# Patient Record
Sex: Male | Born: 1940 | Race: White | Hispanic: No | Marital: Married
Health system: Southern US, Community
[De-identification: ages and names within clinical notes are randomized; demographics above are authoritative.]

---

## 2003-05-31 ENCOUNTER — Encounter: Admission: RE | Admit: 2003-05-31 | Discharge: 2003-05-31 | Payer: Self-pay | Admitting: Family Medicine

## 2003-11-16 ENCOUNTER — Encounter (INDEPENDENT_AMBULATORY_CARE_PROVIDER_SITE_OTHER): Payer: Self-pay | Admitting: Specialist

## 2003-11-16 ENCOUNTER — Inpatient Hospital Stay (HOSPITAL_COMMUNITY): Admission: RE | Admit: 2003-11-16 | Discharge: 2003-11-18 | Payer: Self-pay | Admitting: Urology

## 2004-06-06 ENCOUNTER — Emergency Department (HOSPITAL_COMMUNITY): Admission: EM | Admit: 2004-06-06 | Discharge: 2004-06-07 | Payer: Self-pay | Admitting: Emergency Medicine

## 2006-06-18 ENCOUNTER — Encounter: Admission: RE | Admit: 2006-06-18 | Discharge: 2006-06-18 | Payer: Self-pay | Admitting: Surgery

## 2010-04-10 ENCOUNTER — Ambulatory Visit
Admission: RE | Admit: 2010-04-10 | Discharge: 2010-04-18 | Payer: Self-pay | Source: Home / Self Care | Admitting: Radiation Oncology

## 2010-04-23 ENCOUNTER — Ambulatory Visit (HOSPITAL_COMMUNITY): Admission: RE | Admit: 2010-04-23 | Discharge: 2010-04-23 | Payer: Self-pay | Admitting: *Deleted

## 2010-05-02 ENCOUNTER — Ambulatory Visit
Admission: RE | Admit: 2010-05-02 | Discharge: 2010-07-12 | Payer: Self-pay | Source: Home / Self Care | Attending: Radiation Oncology | Admitting: Radiation Oncology

## 2010-11-30 NOTE — Op Note (Signed)
NAME:  GAGAN, DILLION NO.:  [id number]   MEDICAL RECORD NO.:  [id number]          PATIENT TYPE:  EMS   LOCATION:  ED                           FACILITY:  [facility]   PHYSICIAN:  Rozanna Boer., M.D.DATE OF BIRTH:  July 12, 1941   DATE OF PROCEDURE:  06/07/2004  DATE OF DISCHARGE:                                 OPERATIVE REPORT   HISTORY OF PRESENT ILLNESS:  This 70 year old patient came to the emergency  room in urinary retention. He had had a prostatectomy by Dr. Logan Bores for  prostate cancer in May. He had had progressive slowing of his stream so that  when he was on vacation at [facility] earlier today he actually went into  urinary retention. A nurse practitioner was able to get a pediatric catheter  in his bladder to give him some relief, but on the way home, he was unable  to urinate and came in in urinary retention. He had difficulty voiding when  the original catheter came out after surgery, but he was able to do okay  since then. He stopped using pads a couple of months ago. He has no other  significant medical problems. Takes no other medication. His vital signs  were normal. Past medical history, social history, and review of systems  noted on the previous medical record.   PHYSICAL EXAMINATION:  GENERAL:  The patient is a bearded white male in no  acute distress. He has gray hair.  HEENT:  Clear.  NECK:  Supple.  LUNGS:  Clear.  ABDOMEN:  Soft but distended. He had a low midline abdominal scar that had a  keloid appearance. The penis was normal circumcised, adequate meatus,  bilateral distended testes, epididymis nontender.  RECTAL:  Fossa was empty.  EXTREMITIES:  Negative. No edema. Good distal pulses.   I prepped him with Betadine and then passed a flexible cystoscope. No  anterior strictures were seen, but the posterior urethra showed a tight  bladder neck contracture of about 6 [place] or so. I was able through the  flexible cystoscope get  a guide wire through this under direct vision. Over  the guide wire, I then tried to pass a #16 Council catheter, but this did  not go. I then was able to pass a #14 dilator over the guide wire, but I  could not get a #16 to go. I then under direct vision passed a filiform into  the bladder and then over this used a #18 follower and was able to finally  break through the bladder neck contracture. This relieved his urine  retention. I then pulled this out and was able to get a #16 Foley catheter  into the bladder and inflated the balloon to 10 cc. The bladder drained, and  he was sent home with his Foley catheter as well as a leg bag and was put on  Cipro for 5 days and given some Vicodin for pain. He has an appointment to  see Dr. Logan Bores on December 2, and at that time, Dr. Logan Bores can decide whether  or not to give a voiding trial  or do soft catheter dilations or even  schedule him for surgery if this recurs.   IMPRESSION:  1.  Bladder neck contraction.  2.  Previous carcinoma of the prostate with radical retropubic prostatectomy      in May of 2005.    HMK/MEDQ  D:  06/07/2004  T:  06/07/2004  Job:  [id number]

## 2010-11-30 NOTE — Op Note (Signed)
NAME:  Brian Mosley, Brian Mosley                  ACCOUNT NO.:  [id number]   MEDICAL RECORD NO.:  [id number]                   PATIENT TYPE:  INP   LOCATION:  X002                                 FACILITY:  [facility]   PHYSICIAN:  Jamison Neighbor, M.D.               DATE OF BIRTH:  1940-11-10   DATE OF PROCEDURE:  11/16/2003  DATE OF DISCHARGE:                                 OPERATIVE REPORT   PREOPERATIVE DIAGNOSIS:  Carcinoma of the prostate.   POSTOPERATIVE DIAGNOSIS:  Carcinoma of the prostate.   PROCEDURE:  Radical retropubic prostatectomy.   SURGEON:  Jamison Neighbor, M.D.   ASSISTANT:  Lucrezia Starch. Earlene Plater, M.D.   ANESTHESIA:  General.   COMPLICATIONS:  None.   DRAINS:  A 20 French Silastic Foley catheter and also a Jackson-Pratt drain.   HISTORY:  This 70 year old male had a change in his PSA.  For this reason,  was evaluated with a prostatic ultrasound and biopsy.  His PSA was 5.15.  The patient had a Gleason score of 6 on the right and a Gleason score of 6  on the left.  These were low-volume disease with only 10% of the cores on  the right being positive and 5% positive on the left.   The patient has been given numerous options for therapy.  We elected not to  do watchful waiting because of his young age.  We discussed radiation  therapy, radiation seed implantation, surgery, as well as more experimental  options such as cryotherapy and laparoscopy.  The patient was fully informed  after all of the potential risks and benefits that he elected to do.  The  patient read extensively and took his time figuring this out and eventually  decided to go ahead with a radical retropubic prostatectomy.  He has been  fully appraised as of the risks and benefits, specifically the incidence of  incontinence and impotence as well as the postoperative recovery period.  Full informed consent was obtained.   PROCEDURE:  After successful induction of general anesthesia, the patient  was  placed in the prone position with a slight bump under his hips to  elevate the pelvis.  He was then prepped and draped in the usual sterile  fashion.  A lower midline incision was made and was carried down through  Scarpa fascia until the rectus abdominus was identified.  The rectus sheath  was split in the midline, and then the rectus muscle and the pyramidalis  muscle were retracted laterally.  Entering the space of Retzius was  performed.  Since the patient had only a Gleason score of 6 disease.  It was  felt that the incidence of a positive node was possibly 1%, and for that  reason, the area was inspected, but a formal node dissection was not done.  The obturator nerves were easily identified and were not covered by any  significant amount of nodal tissue.  All nodal tissue  appeared to be  completely normal, and it was felt that a node dissection was unnecessary.  The endopelvic fascia was opened on each side, extending up to the  puboprostatics.  The puboprostatics were easily identified and were  transected, dropping the prostate off the back of the pubic bone.  A stitch  for back-bleeding was placed to control the back-bleeding from the dorsal  vein.  A Hohenfellner clamp was passed between the urethra and the dorsal  vein complex, and the dorsal vein was then tied off.  It revealed some  lateral bleeders.  Additional over-sewing of the dorsal vein was performed  until completely controlled.  The urethra was then cut on the top side with  a nice stump of the urethra obtained.  The back of the urethra was then cut,  transecting the urethra.  The Foley catheter was used to elevate the  prostate.  The lateral pedicles were taken down with great care taken to  insure the neurovascular bundle had been released.  The pedicles were  controlled on each side with Weck clips.  The Denonvilliers fascia was then  cut off the top of the vasa and seminal vesicles, and these were each  clipped and  divided.  The bladder neck was then transected.  An attempt was  made to preserve as much of the circular fibers of the bladder as possible;  however, it was very clear that the patient had medium-load extension.  This  needed to be shelved out of the prostate.  Great care was taken to avoid any  injury to the trigone or ureters.  This made a somewhat larger bladder neck  opening than usual, but the ureters and trigone were well preserved.  The  few remaining attachments were taken down, and a specimen was sent.  It  appeared to be an intact prostate with just the beginnings of the seminal  vesicles and vasa attached.  Inspection showed good hemostasis.  Attention  was then directed to the bladder neck.  The mucosa was everted with a series  of 4-0 Vicryl sutures, three horizontal mattress sutures of 2-0 Vicryl were  used to close the bottom of the bladder neck in a standard tennis-racket  fashion, and this was utilized to close the opening down to where it would  take the tip of the fifth finger.  The urethral stump was then visualized.  Five sutures of Monocryl double-armed were then placed at the 12 o'clock, 2  o'clock, 5 o'clock, 7 o'clock, and 10 o'clock positions.  They were then  placed into the urethra at the same level and tied down appropriately.  The  Foley catheter had been inserted, and the Foley catheter was then inflated.  A Jackson-Pratt drain was placed and sutured in place with a silk suture.  The incision was then closed with a running suture of #1 PDS.  The skin was  closed with surgical clips.  The patient tolerated the procedure well and  was taken to the recovery room in good condition.  All of the sponge,  instrument, and needle counts were ascertained to be correct.                                               Jamison Neighbor, M.D.    RJE/MEDQ  D:  11/16/2003  T:  11/16/2003  Job:  [id number]  cc:   Vale Haven. Andrey Campanile, M.D.  [address]  [place]  [place]  [address]  Fax: [phone]

## 2010-11-30 NOTE — H&P (Signed)
NAME:  Brian Mosley, Brian Mosley                  ACCOUNT NO.:  [id number]   MEDICAL RECORD NO.:  [id number]                   PATIENT TYPE:  INP   LOCATION:  X002                                 FACILITY:  [facility]   PHYSICIAN:  Jamison Neighbor, M.D.               DATE OF BIRTH:  1941-01-23   DATE OF ADMISSION:  11/16/2003  DATE OF DISCHARGE:                                HISTORY & PHYSICAL   ADMISSION DIAGNOSIS:  Carcinoma of the prostate.   SECONDARY DIAGNOSIS:  Hypertension.   HISTORY:  This 70 year old male developed an elevated PSA of 5.15 and  underwent evaluation with ultrasound and biopsy.  He was found to have a low  volume of prostate cancer, but it was bilateral Gleason score 6.  After  careful assessment of all the pro's and con's, the patient has elected to  have a radical prostatectomy done here.  He has had extensive counseling  that led him to this conclusion, and he is fully aware of the pro's and  con's and risks and benefits.  He is to be admitted following this  procedure.   Patient's past medical history is quite unremarkable.  He has moderate  hypertension, for which he takes hydrochlorothiazide 12.5 mg daily.  He has  had no surgery other than a tonsillectomy at age 34.  Aside from the  hydrochlorothiazide, his other medications are lisinopril 10 mg, Lipitor 10  mg, and 1 aspirin daily, which is on hold.   His review of systems is negative.  The patient denies any ongoing coronary,  pulmonary, musculoskeletal, neurologic, endocrine, vascular,  gastrointestinal, dermatologic, or other urologic disorders.   He drinks modest amounts of alcohol and has not used tobacco in over 30  years.   His family history is noncontributory.   PHYSICAL EXAMINATION:  VITAL SIGNS:  Temperature 98, pulse 80, respiratory  rate 16, blood pressure 118/78.  GENERAL:  He is a well-developed and well-nourished male in no acute  distress.  Cranial nerves II-XII are grossly intact.  HEENT:  Normocephalic and atraumatic.  NECK:  Supple.  No adenopathy or thyromegaly.  LUNGS:  Clear.  HEART:  Regular rate and rhythm.  No murmurs, thrills, gallops, rubs, or  heaves.  ABDOMEN:  Soft and nontender.  No palpable masses,  rebound or guarding.  GU:  Unremarkable.  His penis is normal.  Testicles are normal in size,  shape, and consistency with no hydroceles, dermatocele, varicocele, or  hernia adenopathy.  RECTAL:  A benign-appearing prostate without obvious nodularity.  There is  clearly no sign of encroachment into the sulcus on either side.  EXTREMITIES:  No clubbing, cyanosis or edema.  NEUROLOGIC:  Appears grossly intact.   IMPRESSION:  Carcinoma of the prostate.   PLAN:  Admit following radical retropubic prostatectomy.  Jamison Neighbor, M.D.    RJE/MEDQ  D:  11/16/2003  T:  11/16/2003  Job:  [id number]   cc:   Vale Haven. Andrey Campanile, M.D.  [address]  [place]  [place] [address]  Fax: [phone]

## 2011-11-04 DIAGNOSIS — C61 Malignant neoplasm of prostate: Secondary | ICD-10-CM | POA: Diagnosis not present

## 2011-11-05 DIAGNOSIS — M545 Low back pain, unspecified: Secondary | ICD-10-CM | POA: Diagnosis not present

## 2011-11-07 ENCOUNTER — Other Ambulatory Visit: Payer: Self-pay | Admitting: Family Medicine

## 2011-11-07 ENCOUNTER — Ambulatory Visit
Admission: RE | Admit: 2011-11-07 | Discharge: 2011-11-07 | Disposition: A | Discharge status: Home / Self Care | Payer: Medicare Other | Source: Ambulatory Visit | Attending: Family Medicine | Admitting: Family Medicine

## 2011-11-07 DIAGNOSIS — M545 Low back pain, unspecified: Secondary | ICD-10-CM

## 2011-11-07 DIAGNOSIS — M47817 Spondylosis without myelopathy or radiculopathy, lumbosacral region: Secondary | ICD-10-CM | POA: Diagnosis not present

## 2011-11-07 DIAGNOSIS — M25559 Pain in unspecified hip: Secondary | ICD-10-CM | POA: Diagnosis not present

## 2011-11-07 DIAGNOSIS — M5137 Other intervertebral disc degeneration, lumbosacral region: Secondary | ICD-10-CM | POA: Diagnosis not present

## 2011-11-11 DIAGNOSIS — N529 Male erectile dysfunction, unspecified: Secondary | ICD-10-CM | POA: Diagnosis not present

## 2011-11-11 DIAGNOSIS — N486 Induration penis plastica: Secondary | ICD-10-CM | POA: Diagnosis not present

## 2011-11-11 DIAGNOSIS — C61 Malignant neoplasm of prostate: Secondary | ICD-10-CM | POA: Diagnosis not present

## 2011-11-13 ENCOUNTER — Ambulatory Visit: Payer: Medicare Other | Attending: Family Medicine

## 2011-11-13 DIAGNOSIS — IMO0001 Reserved for inherently not codable concepts without codable children: Secondary | ICD-10-CM | POA: Insufficient documentation

## 2011-11-13 DIAGNOSIS — M545 Low back pain, unspecified: Secondary | ICD-10-CM | POA: Diagnosis not present

## 2011-11-25 ENCOUNTER — Ambulatory Visit: Payer: Medicare Other | Admitting: Physical Therapy

## 2011-11-27 ENCOUNTER — Ambulatory Visit: Payer: Medicare Other | Admitting: Physical Therapy

## 2011-12-02 ENCOUNTER — Ambulatory Visit: Payer: Medicare Other | Admitting: Physical Therapy

## 2011-12-04 ENCOUNTER — Ambulatory Visit: Payer: Medicare Other | Admitting: Physical Therapy

## 2012-04-27 DIAGNOSIS — L570 Actinic keratosis: Secondary | ICD-10-CM | POA: Diagnosis not present

## 2012-04-27 DIAGNOSIS — L821 Other seborrheic keratosis: Secondary | ICD-10-CM | POA: Diagnosis not present

## 2012-04-28 DIAGNOSIS — Z79899 Other long term (current) drug therapy: Secondary | ICD-10-CM | POA: Diagnosis not present

## 2012-04-28 DIAGNOSIS — E7800 Pure hypercholesterolemia, unspecified: Secondary | ICD-10-CM | POA: Diagnosis not present

## 2012-04-28 DIAGNOSIS — Z23 Encounter for immunization: Secondary | ICD-10-CM | POA: Diagnosis not present

## 2012-05-04 DIAGNOSIS — C61 Malignant neoplasm of prostate: Secondary | ICD-10-CM | POA: Diagnosis not present

## 2012-05-11 DIAGNOSIS — N529 Male erectile dysfunction, unspecified: Secondary | ICD-10-CM | POA: Diagnosis not present

## 2012-05-11 DIAGNOSIS — C61 Malignant neoplasm of prostate: Secondary | ICD-10-CM | POA: Diagnosis not present

## 2012-05-22 DIAGNOSIS — I10 Essential (primary) hypertension: Secondary | ICD-10-CM | POA: Diagnosis not present

## 2012-05-22 DIAGNOSIS — Z79899 Other long term (current) drug therapy: Secondary | ICD-10-CM | POA: Diagnosis not present

## 2012-05-22 DIAGNOSIS — C61 Malignant neoplasm of prostate: Secondary | ICD-10-CM | POA: Diagnosis not present

## 2012-05-22 DIAGNOSIS — E7800 Pure hypercholesterolemia, unspecified: Secondary | ICD-10-CM | POA: Diagnosis not present

## 2012-05-22 DIAGNOSIS — Z0000 Encounter for general adult medical examination without abnormal findings: Secondary | ICD-10-CM | POA: Diagnosis not present

## 2012-05-28 DIAGNOSIS — H2510 Age-related nuclear cataract, unspecified eye: Secondary | ICD-10-CM | POA: Diagnosis not present

## 2012-11-12 DIAGNOSIS — C61 Malignant neoplasm of prostate: Secondary | ICD-10-CM | POA: Diagnosis not present

## 2012-11-20 DIAGNOSIS — N486 Induration penis plastica: Secondary | ICD-10-CM | POA: Diagnosis not present

## 2012-11-20 DIAGNOSIS — N529 Male erectile dysfunction, unspecified: Secondary | ICD-10-CM | POA: Diagnosis not present

## 2012-11-20 DIAGNOSIS — C61 Malignant neoplasm of prostate: Secondary | ICD-10-CM | POA: Diagnosis not present

## 2013-04-27 DIAGNOSIS — L570 Actinic keratosis: Secondary | ICD-10-CM | POA: Diagnosis not present

## 2013-04-27 DIAGNOSIS — L821 Other seborrheic keratosis: Secondary | ICD-10-CM | POA: Diagnosis not present

## 2013-04-27 DIAGNOSIS — D2360 Other benign neoplasm of skin of unspecified upper limb, including shoulder: Secondary | ICD-10-CM | POA: Diagnosis not present

## 2013-05-27 DIAGNOSIS — R599 Enlarged lymph nodes, unspecified: Secondary | ICD-10-CM | POA: Diagnosis not present

## 2013-05-27 DIAGNOSIS — E7800 Pure hypercholesterolemia, unspecified: Secondary | ICD-10-CM | POA: Diagnosis not present

## 2013-05-27 DIAGNOSIS — I10 Essential (primary) hypertension: Secondary | ICD-10-CM | POA: Diagnosis not present

## 2013-05-27 DIAGNOSIS — E559 Vitamin D deficiency, unspecified: Secondary | ICD-10-CM | POA: Diagnosis not present

## 2013-05-27 DIAGNOSIS — Z0000 Encounter for general adult medical examination without abnormal findings: Secondary | ICD-10-CM | POA: Diagnosis not present

## 2013-05-27 DIAGNOSIS — C61 Malignant neoplasm of prostate: Secondary | ICD-10-CM | POA: Diagnosis not present

## 2013-05-27 DIAGNOSIS — Z23 Encounter for immunization: Secondary | ICD-10-CM | POA: Diagnosis not present

## 2013-05-28 ENCOUNTER — Other Ambulatory Visit: Payer: Self-pay | Admitting: Family Medicine

## 2013-05-28 DIAGNOSIS — IMO0002 Reserved for concepts with insufficient information to code with codable children: Secondary | ICD-10-CM

## 2013-05-31 ENCOUNTER — Ambulatory Visit
Admission: RE | Admit: 2013-05-31 | Discharge: 2013-05-31 | Disposition: A | Discharge status: Home / Self Care | Payer: Medicare Other | Source: Ambulatory Visit | Attending: Family Medicine | Admitting: Family Medicine

## 2013-05-31 DIAGNOSIS — R599 Enlarged lymph nodes, unspecified: Secondary | ICD-10-CM | POA: Diagnosis not present

## 2013-05-31 DIAGNOSIS — H2510 Age-related nuclear cataract, unspecified eye: Secondary | ICD-10-CM | POA: Diagnosis not present

## 2013-05-31 DIAGNOSIS — IMO0002 Reserved for concepts with insufficient information to code with codable children: Secondary | ICD-10-CM

## 2013-10-25 DIAGNOSIS — E559 Vitamin D deficiency, unspecified: Secondary | ICD-10-CM | POA: Diagnosis not present

## 2013-10-25 DIAGNOSIS — E7800 Pure hypercholesterolemia, unspecified: Secondary | ICD-10-CM | POA: Diagnosis not present

## 2013-10-25 DIAGNOSIS — M5430 Sciatica, unspecified side: Secondary | ICD-10-CM | POA: Diagnosis not present

## 2013-10-25 DIAGNOSIS — M47817 Spondylosis without myelopathy or radiculopathy, lumbosacral region: Secondary | ICD-10-CM | POA: Diagnosis not present

## 2013-10-26 ENCOUNTER — Other Ambulatory Visit: Payer: Self-pay | Admitting: Family Medicine

## 2013-10-26 DIAGNOSIS — IMO0002 Reserved for concepts with insufficient information to code with codable children: Secondary | ICD-10-CM

## 2013-10-26 DIAGNOSIS — M5430 Sciatica, unspecified side: Secondary | ICD-10-CM

## 2013-10-26 DIAGNOSIS — Z8546 Personal history of malignant neoplasm of prostate: Secondary | ICD-10-CM

## 2013-10-30 ENCOUNTER — Ambulatory Visit
Admission: RE | Admit: 2013-10-30 | Discharge: 2013-10-30 | Disposition: A | Discharge status: Home / Self Care | Payer: Medicare Other | Source: Ambulatory Visit | Attending: Family Medicine | Admitting: Family Medicine

## 2013-10-30 DIAGNOSIS — M5430 Sciatica, unspecified side: Secondary | ICD-10-CM

## 2013-10-30 DIAGNOSIS — M5137 Other intervertebral disc degeneration, lumbosacral region: Secondary | ICD-10-CM | POA: Diagnosis not present

## 2013-10-30 DIAGNOSIS — M47817 Spondylosis without myelopathy or radiculopathy, lumbosacral region: Secondary | ICD-10-CM | POA: Diagnosis not present

## 2013-10-30 DIAGNOSIS — Z8546 Personal history of malignant neoplasm of prostate: Secondary | ICD-10-CM

## 2013-10-30 DIAGNOSIS — M4310 Spondylolisthesis, site unspecified: Secondary | ICD-10-CM | POA: Diagnosis not present

## 2013-10-30 DIAGNOSIS — IMO0002 Reserved for concepts with insufficient information to code with codable children: Secondary | ICD-10-CM

## 2013-11-01 DIAGNOSIS — C61 Malignant neoplasm of prostate: Secondary | ICD-10-CM | POA: Diagnosis not present

## 2013-11-08 DIAGNOSIS — N486 Induration penis plastica: Secondary | ICD-10-CM | POA: Diagnosis not present

## 2013-11-08 DIAGNOSIS — N529 Male erectile dysfunction, unspecified: Secondary | ICD-10-CM | POA: Diagnosis not present

## 2013-11-08 DIAGNOSIS — C61 Malignant neoplasm of prostate: Secondary | ICD-10-CM | POA: Diagnosis not present

## 2013-11-30 DIAGNOSIS — IMO0002 Reserved for concepts with insufficient information to code with codable children: Secondary | ICD-10-CM | POA: Diagnosis not present

## 2013-11-30 DIAGNOSIS — M5137 Other intervertebral disc degeneration, lumbosacral region: Secondary | ICD-10-CM | POA: Diagnosis not present

## 2013-11-30 DIAGNOSIS — M545 Low back pain, unspecified: Secondary | ICD-10-CM | POA: Diagnosis not present

## 2013-11-30 DIAGNOSIS — M546 Pain in thoracic spine: Secondary | ICD-10-CM | POA: Diagnosis not present

## 2013-11-30 DIAGNOSIS — M47817 Spondylosis without myelopathy or radiculopathy, lumbosacral region: Secondary | ICD-10-CM | POA: Diagnosis not present

## 2013-11-30 DIAGNOSIS — Q762 Congenital spondylolisthesis: Secondary | ICD-10-CM | POA: Diagnosis not present

## 2014-03-05 IMAGING — US US SOFT TISSUE HEAD/NECK
1 series · 12 of 12 positions shown · non-contrast
Comparison: None.

CLINICAL DATA: Left cervical lymph node.

EXAM:
ULTRASOUND OF HEAD/NECK SOFT TISSUES
TECHNIQUE: Ultrasound examination of the head and neck soft tissues was
performed in the area of clinical concern.

[Series 1: us soft tissue head/neck · 0.05mm/px · 12 acquisitions, 12 frames shown]
[im 1/12]
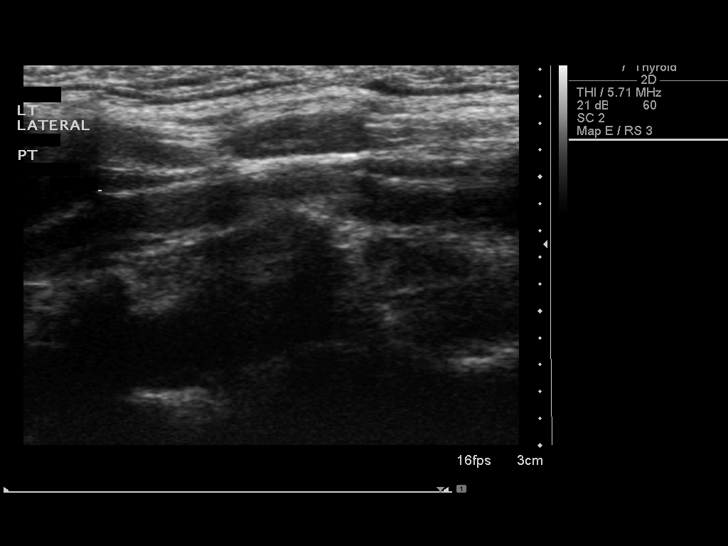
[im 2/12]
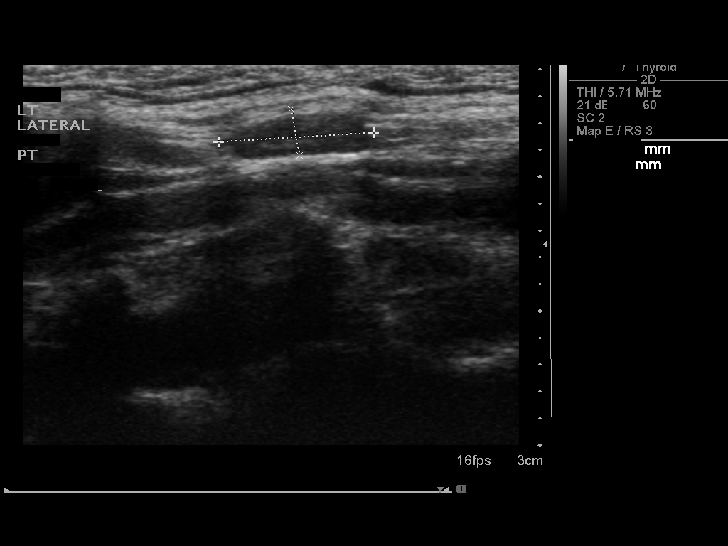
[im 3/12]
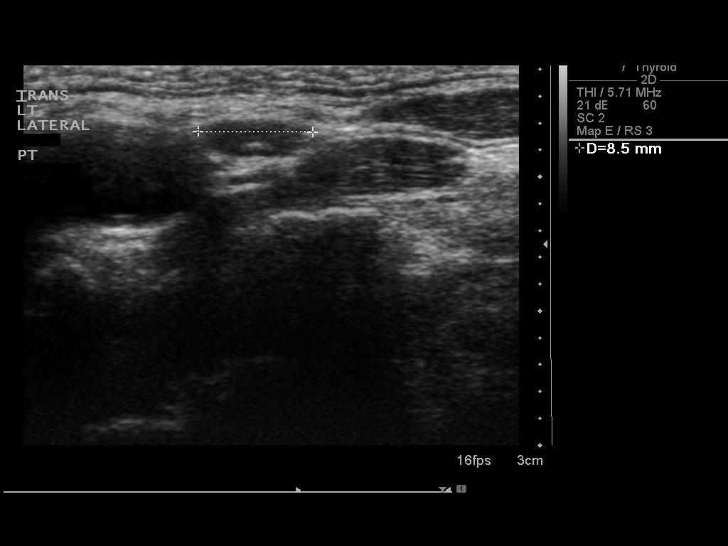
[im 4/12]
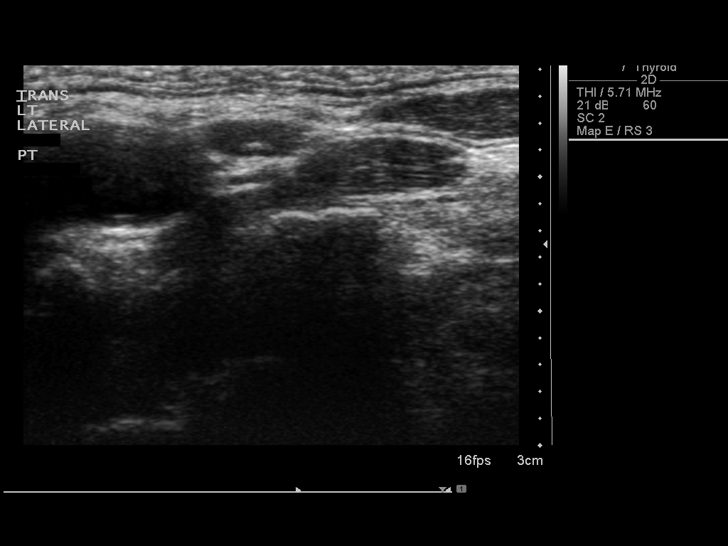
[im 5/12]
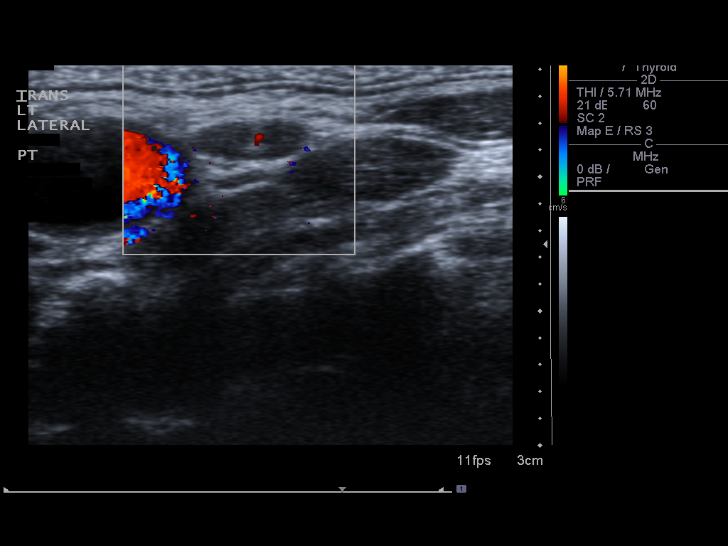
[im 6/12]
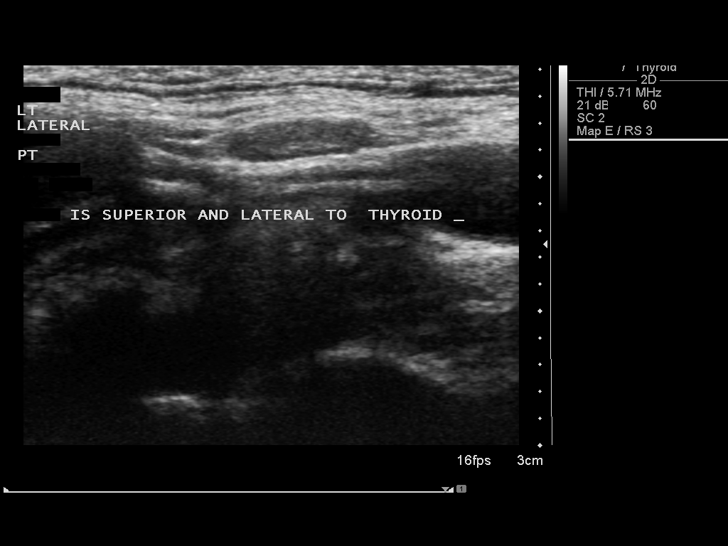
[im 7/12]
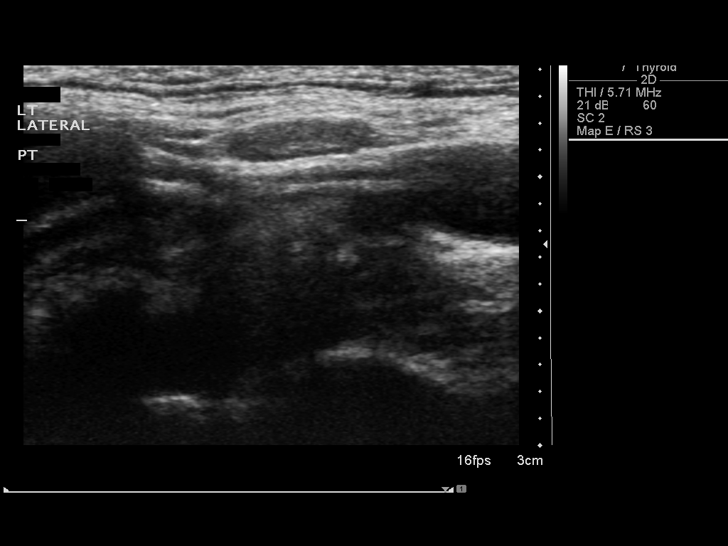
[im 8/12]
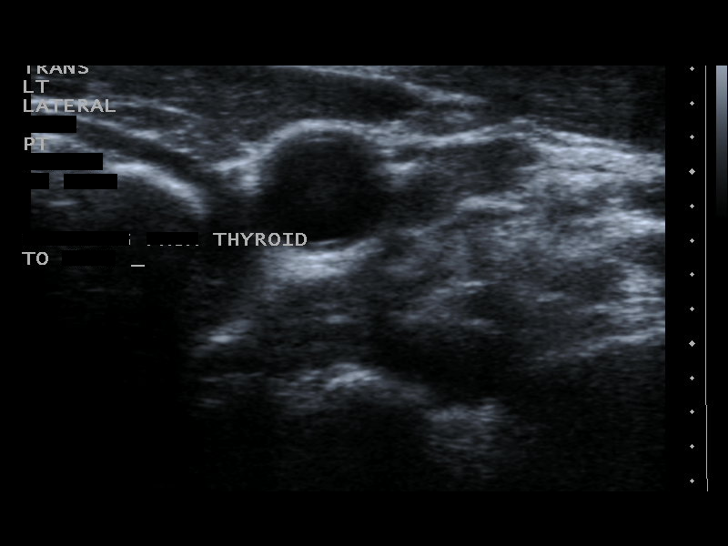
[im 9/12]
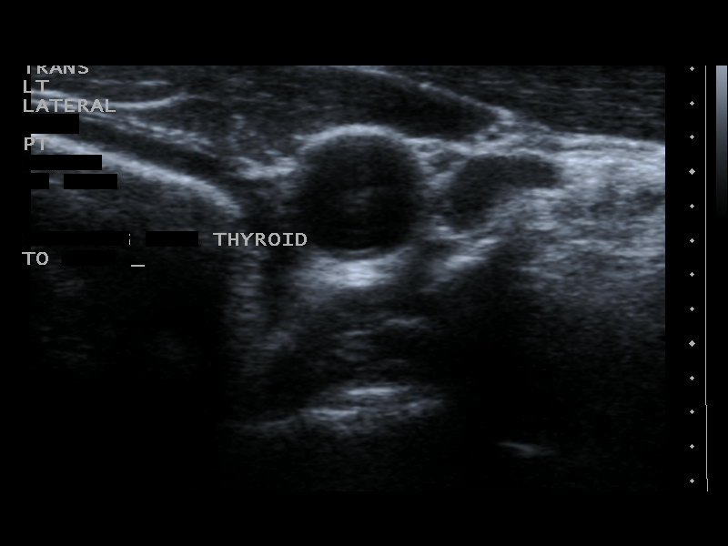
[im 10/12]
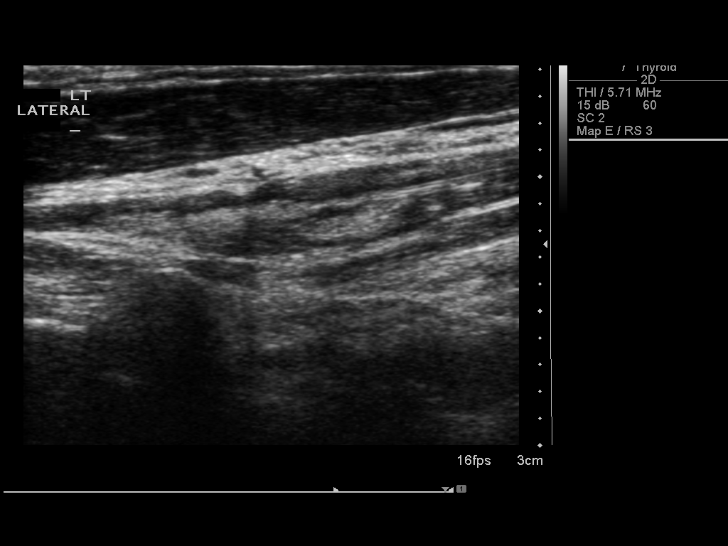
[im 11/12]
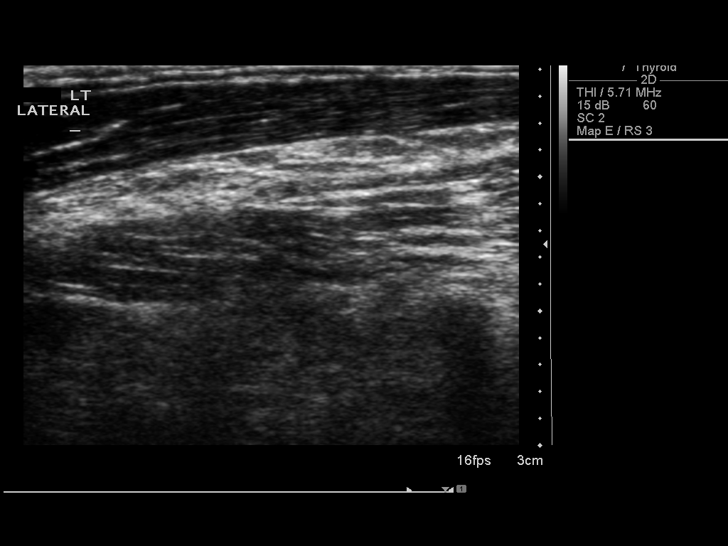
[im 12/12]
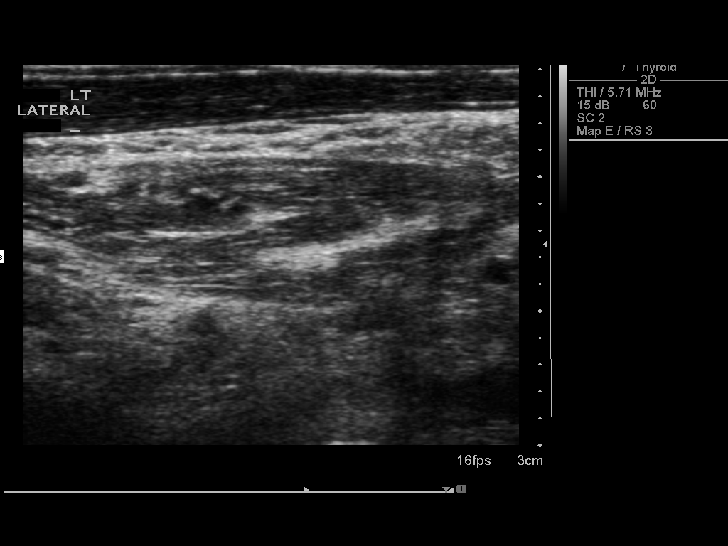

[12 of 12 positions shown; findings below may reference images not displayed]

FINDINGS: The area of palpable corresponds to a small non pathologically
enlarged lateral left cervical lymph node. This measures 1.2 x 0.9 x
0.4 cm. This maintains a normal appearing central fatty hilum. No
other soft tissue abnormality visualized in the area.
IMPRESSION: Area palpated represents a small cervical lymph node, not
pathologically enlarged based on imaging size criteria (4 mm short
axis diameter). This could be followed clinically.

## 2014-04-26 DIAGNOSIS — L821 Other seborrheic keratosis: Secondary | ICD-10-CM | POA: Diagnosis not present

## 2014-05-26 DIAGNOSIS — H2513 Age-related nuclear cataract, bilateral: Secondary | ICD-10-CM | POA: Diagnosis not present

## 2014-06-06 DIAGNOSIS — C61 Malignant neoplasm of prostate: Secondary | ICD-10-CM | POA: Diagnosis not present

## 2014-06-06 DIAGNOSIS — E780 Pure hypercholesterolemia: Secondary | ICD-10-CM | POA: Diagnosis not present

## 2014-06-06 DIAGNOSIS — Z0000 Encounter for general adult medical examination without abnormal findings: Secondary | ICD-10-CM | POA: Diagnosis not present

## 2014-06-06 DIAGNOSIS — R12 Heartburn: Secondary | ICD-10-CM | POA: Diagnosis not present

## 2014-06-06 DIAGNOSIS — I10 Essential (primary) hypertension: Secondary | ICD-10-CM | POA: Diagnosis not present

## 2014-06-06 DIAGNOSIS — Z125 Encounter for screening for malignant neoplasm of prostate: Secondary | ICD-10-CM | POA: Diagnosis not present

## 2014-06-06 DIAGNOSIS — E559 Vitamin D deficiency, unspecified: Secondary | ICD-10-CM | POA: Diagnosis not present

## 2014-06-06 DIAGNOSIS — Z1211 Encounter for screening for malignant neoplasm of colon: Secondary | ICD-10-CM | POA: Diagnosis not present

## 2014-06-06 DIAGNOSIS — Z23 Encounter for immunization: Secondary | ICD-10-CM | POA: Diagnosis not present

## 2014-06-16 DIAGNOSIS — L820 Inflamed seborrheic keratosis: Secondary | ICD-10-CM | POA: Diagnosis not present

## 2014-06-24 ENCOUNTER — Other Ambulatory Visit: Payer: Self-pay | Admitting: Gastroenterology

## 2014-06-24 DIAGNOSIS — K219 Gastro-esophageal reflux disease without esophagitis: Secondary | ICD-10-CM | POA: Diagnosis not present

## 2014-06-24 DIAGNOSIS — R1310 Dysphagia, unspecified: Secondary | ICD-10-CM

## 2014-06-24 DIAGNOSIS — Z86010 Personal history of colonic polyps: Secondary | ICD-10-CM | POA: Diagnosis not present

## 2014-06-24 DIAGNOSIS — K5900 Constipation, unspecified: Secondary | ICD-10-CM | POA: Diagnosis not present

## 2014-06-29 ENCOUNTER — Other Ambulatory Visit: Payer: Medicare Other

## 2014-06-30 ENCOUNTER — Ambulatory Visit
Admission: RE | Admit: 2014-06-30 | Discharge: 2014-06-30 | Disposition: A | Discharge status: Home / Self Care | Payer: Medicare Other | Source: Ambulatory Visit | Attending: Gastroenterology | Admitting: Gastroenterology

## 2014-06-30 DIAGNOSIS — K449 Diaphragmatic hernia without obstruction or gangrene: Secondary | ICD-10-CM | POA: Diagnosis not present

## 2014-06-30 DIAGNOSIS — K219 Gastro-esophageal reflux disease without esophagitis: Secondary | ICD-10-CM | POA: Diagnosis not present

## 2014-06-30 DIAGNOSIS — R1310 Dysphagia, unspecified: Secondary | ICD-10-CM

## 2014-06-30 DIAGNOSIS — Z8546 Personal history of malignant neoplasm of prostate: Secondary | ICD-10-CM | POA: Diagnosis not present

## 2014-07-06 DIAGNOSIS — R208 Other disturbances of skin sensation: Secondary | ICD-10-CM | POA: Diagnosis not present

## 2014-07-11 DIAGNOSIS — K219 Gastro-esophageal reflux disease without esophagitis: Secondary | ICD-10-CM | POA: Diagnosis not present

## 2014-07-11 DIAGNOSIS — K5900 Constipation, unspecified: Secondary | ICD-10-CM | POA: Diagnosis not present

## 2014-07-11 DIAGNOSIS — Z86010 Personal history of colonic polyps: Secondary | ICD-10-CM | POA: Diagnosis not present

## 2014-07-11 DIAGNOSIS — R1310 Dysphagia, unspecified: Secondary | ICD-10-CM | POA: Diagnosis not present

## 2014-10-10 DIAGNOSIS — M545 Low back pain: Secondary | ICD-10-CM | POA: Diagnosis not present

## 2014-10-10 DIAGNOSIS — E785 Hyperlipidemia, unspecified: Secondary | ICD-10-CM | POA: Diagnosis not present

## 2014-10-10 DIAGNOSIS — C61 Malignant neoplasm of prostate: Secondary | ICD-10-CM | POA: Diagnosis not present

## 2014-10-10 DIAGNOSIS — I10 Essential (primary) hypertension: Secondary | ICD-10-CM | POA: Diagnosis not present

## 2014-10-31 ENCOUNTER — Other Ambulatory Visit: Payer: Self-pay | Admitting: Gastroenterology

## 2014-10-31 DIAGNOSIS — Z09 Encounter for follow-up examination after completed treatment for conditions other than malignant neoplasm: Secondary | ICD-10-CM | POA: Diagnosis not present

## 2014-10-31 DIAGNOSIS — D126 Benign neoplasm of colon, unspecified: Secondary | ICD-10-CM | POA: Diagnosis not present

## 2014-10-31 DIAGNOSIS — D120 Benign neoplasm of cecum: Secondary | ICD-10-CM | POA: Diagnosis not present

## 2014-10-31 DIAGNOSIS — K621 Rectal polyp: Secondary | ICD-10-CM | POA: Diagnosis not present

## 2014-10-31 DIAGNOSIS — Z86010 Personal history of colonic polyps: Secondary | ICD-10-CM | POA: Diagnosis not present

## 2014-11-04 DIAGNOSIS — C61 Malignant neoplasm of prostate: Secondary | ICD-10-CM | POA: Diagnosis not present

## 2014-11-11 DIAGNOSIS — C61 Malignant neoplasm of prostate: Secondary | ICD-10-CM | POA: Diagnosis not present

## 2014-11-11 DIAGNOSIS — N5201 Erectile dysfunction due to arterial insufficiency: Secondary | ICD-10-CM | POA: Diagnosis not present

## 2015-04-04 IMAGING — RF DG ESOPHAGUS
19 of 24 series · 19 of 24 positions shown · non-contrast
Comparison: None.

CLINICAL DATA: Dysphagia, history of prostate carcinoma and
prostatectomy

EXAM:
ESOPHOGRAM / BARIUM SWALLOW / BARIUM TABLET STUDY
TECHNIQUE: Combined double contrast and single contrast examination performed
using effervescent crystals, thick barium liquid, and thin barium
liquid. The patient was observed with fluoroscopy swallowing a 13mm
barium sulphate tablet.
FLUOROSCOPY TIME:  1 min 54 seconds

[Series 2: run · 1 of 1 slices shown (1 of 19)]
[im 1/1]
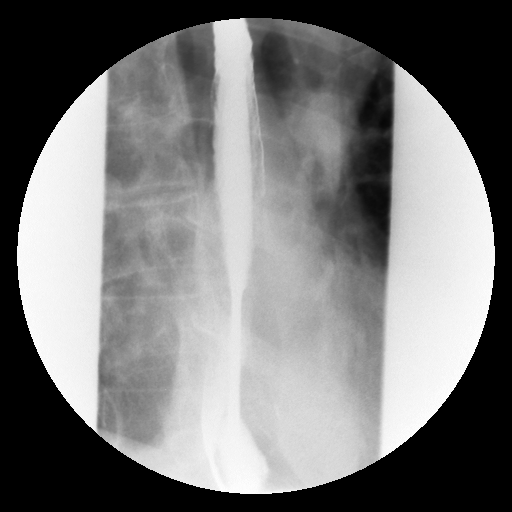

[Series 3: run · 1 of 1 slices shown (2 of 19)]
[im 1/1]
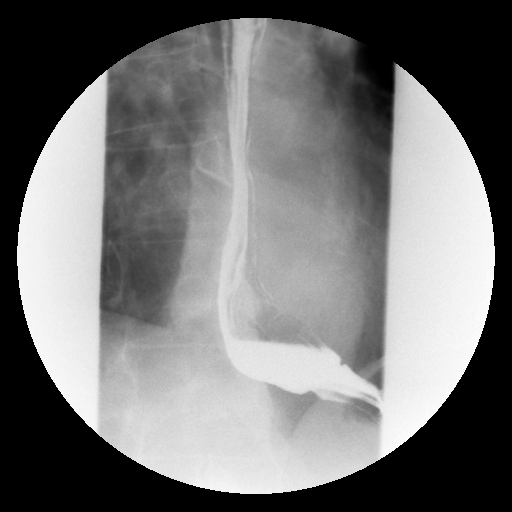

[Series 5: run · 1 of 1 slices shown (3 of 19)]
[im 1/1]
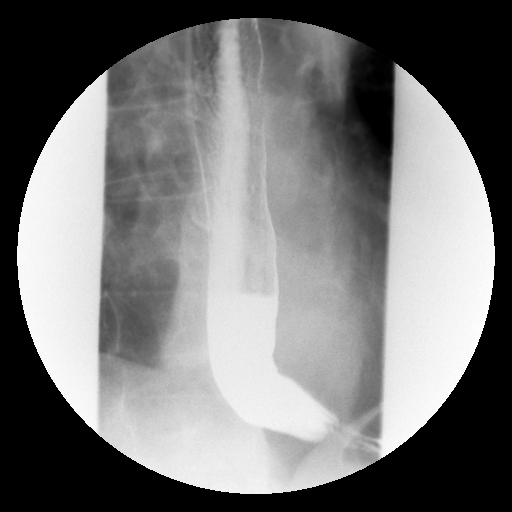

[Series 6: run · 1 of 1 slices shown (4 of 19)]
[im 1/1]
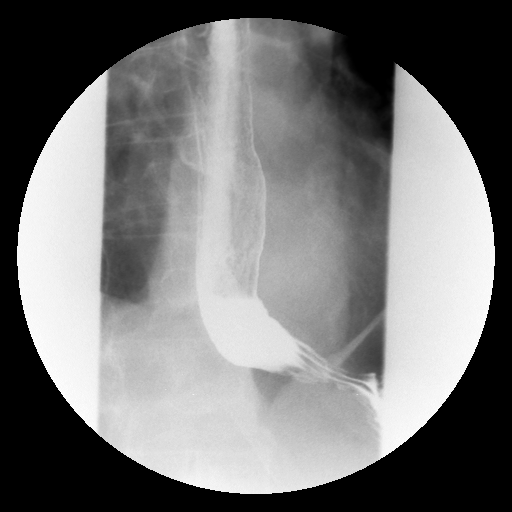

[Series 7: run · 1 of 8 slices shown (5 of 19)]
[im 1/8]
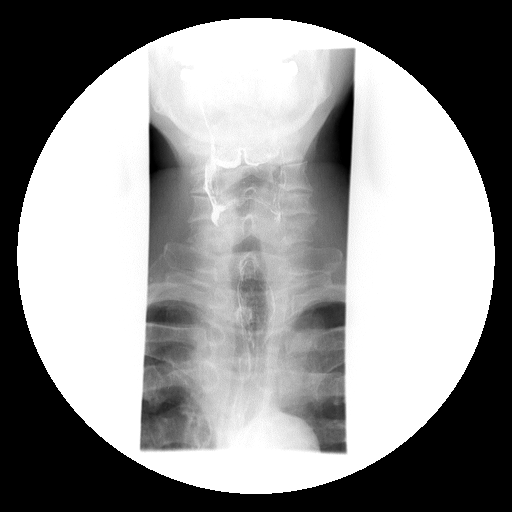

[Series 8: run · 1 of 7 slices shown (6 of 19)]
[im 1/7]
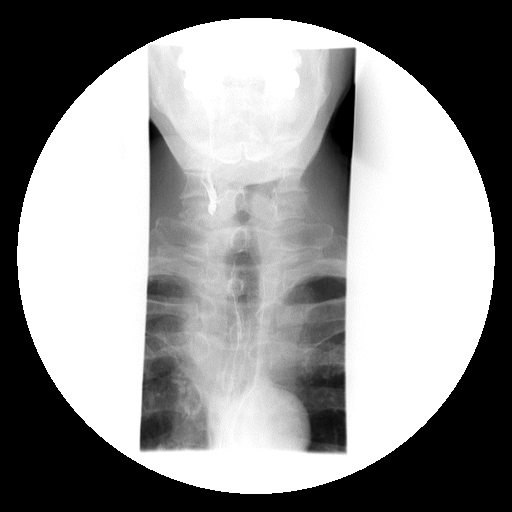

[Series 10: run · 1 of 1 slices shown (7 of 19)]
[im 1/1]
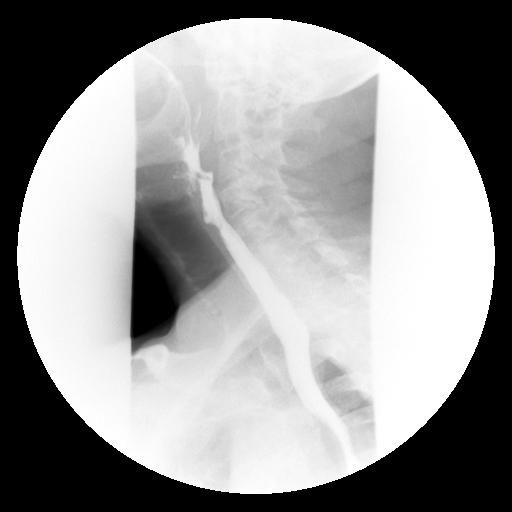

[Series 11: run · 1 of 1 slices shown (8 of 19)]
[im 1/1]
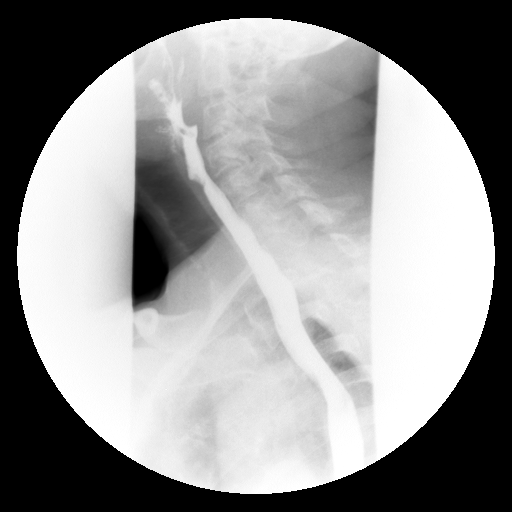

[Series 12: run · 1 of 1 slices shown (9 of 19)]
[im 1/1]
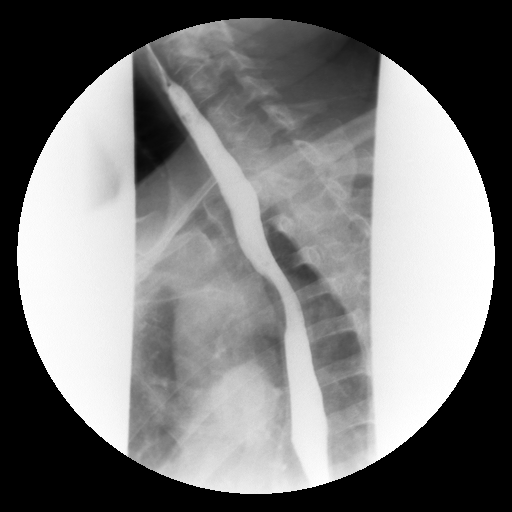

[Series 15: run · 1 of 1 slices shown (10 of 19)]
[im 1/1]
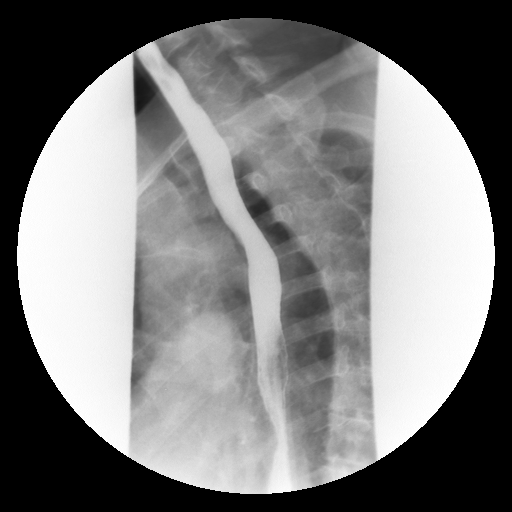

[Series 17: run · 1 of 1 slices shown (11 of 19)]
[im 1/1]
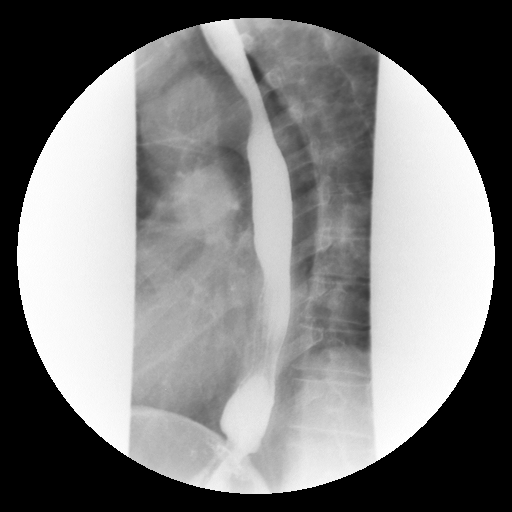

[Series 18: run · 1 of 1 slices shown (12 of 19)]
[im 1/1]
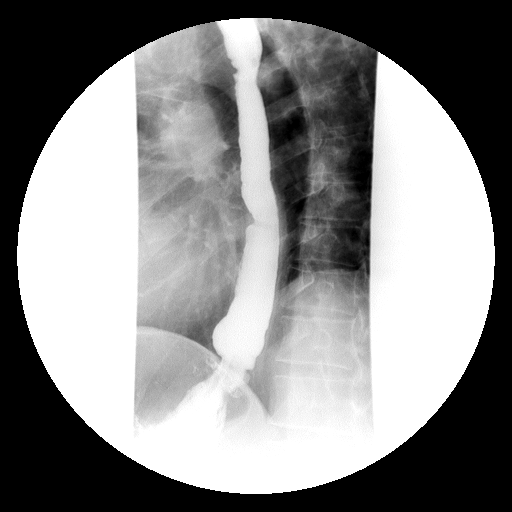

[Series 19: run · 1 of 1 slices shown (13 of 19)]
[im 1/1]
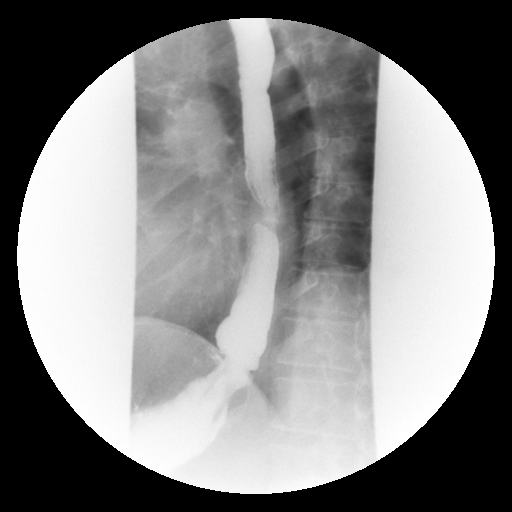

[Series 21: run · 1 of 1 slices shown (14 of 19)]
[im 1/1]
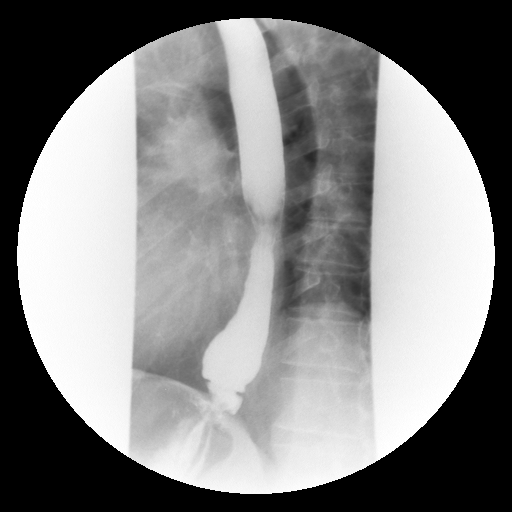

[Series 22: run · 1 of 1 slices shown (15 of 19)]
[im 1/1]
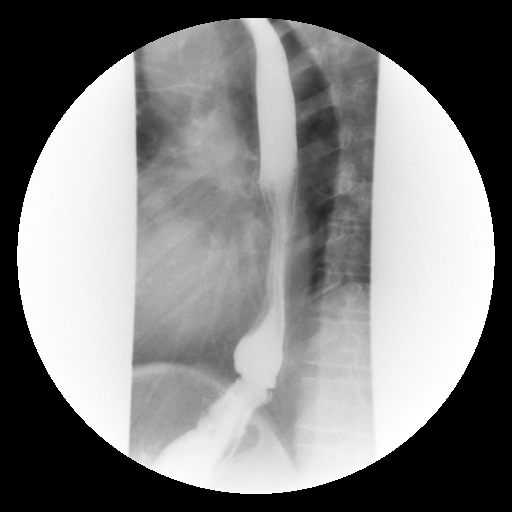

[Series 23: run · 1 of 1 slices shown (16 of 19)]
[im 1/1]
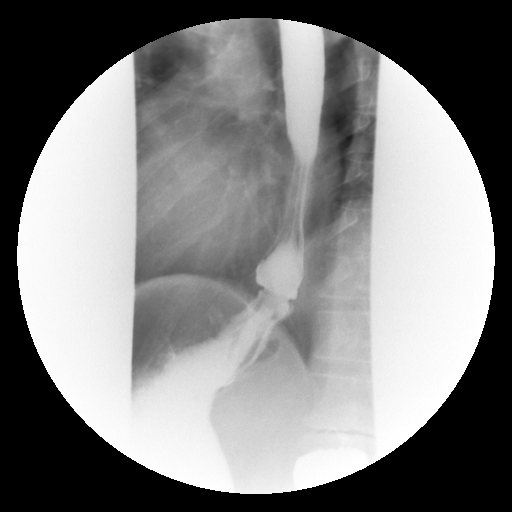

[Series 24: run · 1 of 1 slices shown (17 of 19)]
[im 1/1]
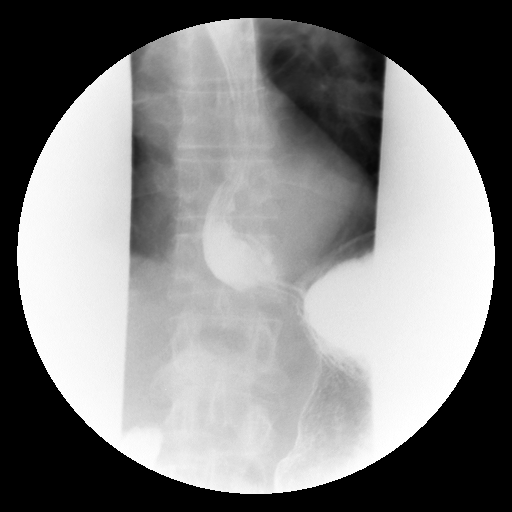

[Series 27: run · 1 of 1 slices shown (18 of 19)]
[im 1/1]
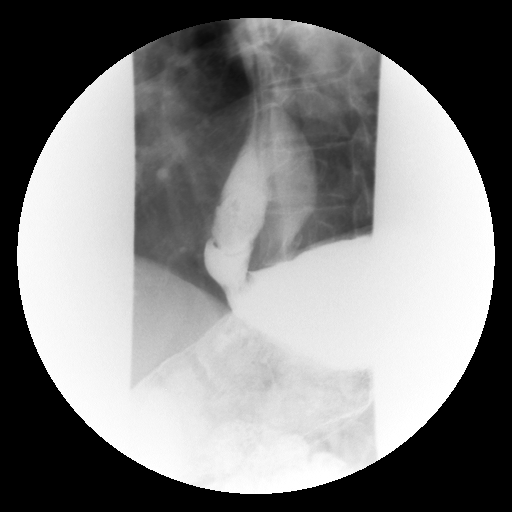

[Series 28: run · 1 of 1 slices shown (19 of 19)]
[im 1/1]
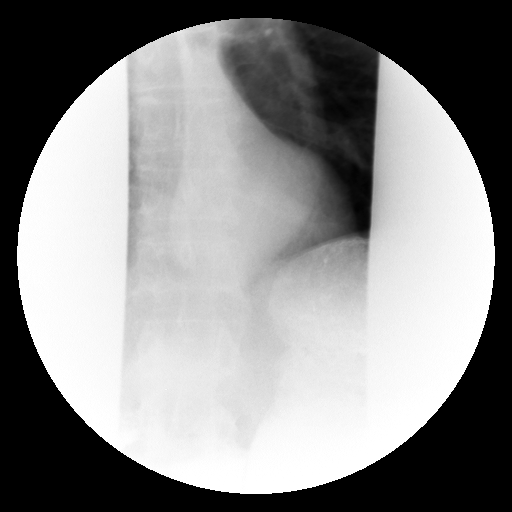

[19 of 24 positions shown; findings below may reference images not displayed]

FINDINGS: A double-contrast barium swallow was performed. The mucosa of the
esophagus is unremarkable. A single contrast study shows the
swallowing mechanism to be normal. Only mild tertiary contractions
are noted in the distal esophagus. There is a small hiatal hernia
present. Moderate gastroesophageal reflux is demonstrated. A barium
pill was given at the end of the study which passed into the stomach
without delay.
IMPRESSION: 1. Small hiatal hernia with moderate gastroesophageal reflux.
2. Barium pill passes into the stomach without delay.
3. Mild tertiary contractions distally.

## 2015-04-14 DIAGNOSIS — Z23 Encounter for immunization: Secondary | ICD-10-CM | POA: Diagnosis not present

## 2015-04-14 DIAGNOSIS — C61 Malignant neoplasm of prostate: Secondary | ICD-10-CM | POA: Diagnosis not present

## 2015-04-14 DIAGNOSIS — E780 Pure hypercholesterolemia: Secondary | ICD-10-CM | POA: Diagnosis not present

## 2015-04-14 DIAGNOSIS — E559 Vitamin D deficiency, unspecified: Secondary | ICD-10-CM | POA: Diagnosis not present

## 2015-04-14 DIAGNOSIS — I10 Essential (primary) hypertension: Secondary | ICD-10-CM | POA: Diagnosis not present

## 2015-04-25 DIAGNOSIS — L821 Other seborrheic keratosis: Secondary | ICD-10-CM | POA: Diagnosis not present

## 2015-04-25 DIAGNOSIS — D1801 Hemangioma of skin and subcutaneous tissue: Secondary | ICD-10-CM | POA: Diagnosis not present

## 2015-04-25 DIAGNOSIS — L570 Actinic keratosis: Secondary | ICD-10-CM | POA: Diagnosis not present

## 2015-04-25 DIAGNOSIS — L814 Other melanin hyperpigmentation: Secondary | ICD-10-CM | POA: Diagnosis not present

## 2015-05-23 DIAGNOSIS — H2513 Age-related nuclear cataract, bilateral: Secondary | ICD-10-CM | POA: Diagnosis not present

## 2015-05-23 DIAGNOSIS — H00029 Hordeolum internum unspecified eye, unspecified eyelid: Secondary | ICD-10-CM | POA: Diagnosis not present

## 2015-06-14 DIAGNOSIS — R748 Abnormal levels of other serum enzymes: Secondary | ICD-10-CM | POA: Diagnosis not present

## 2015-08-08 DIAGNOSIS — K5901 Slow transit constipation: Secondary | ICD-10-CM | POA: Diagnosis not present

## 2015-08-08 DIAGNOSIS — K210 Gastro-esophageal reflux disease with esophagitis: Secondary | ICD-10-CM | POA: Diagnosis not present

## 2015-08-08 DIAGNOSIS — E782 Mixed hyperlipidemia: Secondary | ICD-10-CM | POA: Diagnosis not present

## 2015-08-08 DIAGNOSIS — Z6824 Body mass index (BMI) 24.0-24.9, adult: Secondary | ICD-10-CM | POA: Diagnosis not present

## 2015-08-08 DIAGNOSIS — I499 Cardiac arrhythmia, unspecified: Secondary | ICD-10-CM | POA: Diagnosis not present

## 2015-08-08 DIAGNOSIS — K635 Polyp of colon: Secondary | ICD-10-CM | POA: Diagnosis not present

## 2015-08-08 DIAGNOSIS — I10 Essential (primary) hypertension: Secondary | ICD-10-CM | POA: Diagnosis not present

## 2015-08-08 DIAGNOSIS — C61 Malignant neoplasm of prostate: Secondary | ICD-10-CM | POA: Diagnosis not present

## 2015-08-10 DIAGNOSIS — E782 Mixed hyperlipidemia: Secondary | ICD-10-CM | POA: Diagnosis not present

## 2015-11-10 DIAGNOSIS — C61 Malignant neoplasm of prostate: Secondary | ICD-10-CM | POA: Diagnosis not present

## 2015-11-16 DIAGNOSIS — N5201 Erectile dysfunction due to arterial insufficiency: Secondary | ICD-10-CM | POA: Diagnosis not present

## 2015-11-16 DIAGNOSIS — Z0000 Encounter for general adult medical examination without abnormal findings: Secondary | ICD-10-CM | POA: Diagnosis not present

## 2015-11-16 DIAGNOSIS — N486 Induration penis plastica: Secondary | ICD-10-CM | POA: Diagnosis not present

## 2016-04-09 DIAGNOSIS — H2513 Age-related nuclear cataract, bilateral: Secondary | ICD-10-CM | POA: Diagnosis not present

## 2016-04-09 DIAGNOSIS — H5203 Hypermetropia, bilateral: Secondary | ICD-10-CM | POA: Diagnosis not present

## 2016-04-09 DIAGNOSIS — H52223 Regular astigmatism, bilateral: Secondary | ICD-10-CM | POA: Diagnosis not present

## 2016-04-09 DIAGNOSIS — H524 Presbyopia: Secondary | ICD-10-CM | POA: Diagnosis not present

## 2016-04-11 DIAGNOSIS — D2362 Other benign neoplasm of skin of left upper limb, including shoulder: Secondary | ICD-10-CM | POA: Diagnosis not present

## 2016-04-11 DIAGNOSIS — L570 Actinic keratosis: Secondary | ICD-10-CM | POA: Diagnosis not present

## 2016-04-11 DIAGNOSIS — D1801 Hemangioma of skin and subcutaneous tissue: Secondary | ICD-10-CM | POA: Diagnosis not present

## 2016-04-11 DIAGNOSIS — L821 Other seborrheic keratosis: Secondary | ICD-10-CM | POA: Diagnosis not present

## 2016-04-12 DIAGNOSIS — E7800 Pure hypercholesterolemia, unspecified: Secondary | ICD-10-CM | POA: Diagnosis not present

## 2016-04-12 DIAGNOSIS — Z125 Encounter for screening for malignant neoplasm of prostate: Secondary | ICD-10-CM | POA: Diagnosis not present

## 2016-04-12 DIAGNOSIS — I10 Essential (primary) hypertension: Secondary | ICD-10-CM | POA: Diagnosis not present

## 2016-04-12 DIAGNOSIS — Z23 Encounter for immunization: Secondary | ICD-10-CM | POA: Diagnosis not present

## 2016-04-12 DIAGNOSIS — Z0000 Encounter for general adult medical examination without abnormal findings: Secondary | ICD-10-CM | POA: Diagnosis not present

## 2016-04-12 DIAGNOSIS — R945 Abnormal results of liver function studies: Secondary | ICD-10-CM | POA: Diagnosis not present

## 2016-08-08 DIAGNOSIS — E782 Mixed hyperlipidemia: Secondary | ICD-10-CM | POA: Diagnosis not present

## 2016-08-08 DIAGNOSIS — Z8546 Personal history of malignant neoplasm of prostate: Secondary | ICD-10-CM | POA: Diagnosis not present

## 2016-08-08 DIAGNOSIS — Z6824 Body mass index (BMI) 24.0-24.9, adult: Secondary | ICD-10-CM | POA: Diagnosis not present

## 2016-08-08 DIAGNOSIS — K210 Gastro-esophageal reflux disease with esophagitis: Secondary | ICD-10-CM | POA: Diagnosis not present

## 2016-08-08 DIAGNOSIS — K5901 Slow transit constipation: Secondary | ICD-10-CM | POA: Diagnosis not present

## 2016-08-08 DIAGNOSIS — I10 Essential (primary) hypertension: Secondary | ICD-10-CM | POA: Diagnosis not present

## 2016-11-05 DIAGNOSIS — L570 Actinic keratosis: Secondary | ICD-10-CM | POA: Diagnosis not present

## 2016-11-05 DIAGNOSIS — D692 Other nonthrombocytopenic purpura: Secondary | ICD-10-CM | POA: Diagnosis not present

## 2016-11-12 DIAGNOSIS — C61 Malignant neoplasm of prostate: Secondary | ICD-10-CM | POA: Diagnosis not present

## 2016-11-18 DIAGNOSIS — N486 Induration penis plastica: Secondary | ICD-10-CM | POA: Diagnosis not present

## 2016-11-18 DIAGNOSIS — C61 Malignant neoplasm of prostate: Secondary | ICD-10-CM | POA: Diagnosis not present

## 2016-11-18 DIAGNOSIS — N5201 Erectile dysfunction due to arterial insufficiency: Secondary | ICD-10-CM | POA: Diagnosis not present

## 2017-04-04 DIAGNOSIS — Z23 Encounter for immunization: Secondary | ICD-10-CM | POA: Diagnosis not present

## 2017-04-04 DIAGNOSIS — M7661 Achilles tendinitis, right leg: Secondary | ICD-10-CM | POA: Diagnosis not present

## 2017-04-18 DIAGNOSIS — Z125 Encounter for screening for malignant neoplasm of prostate: Secondary | ICD-10-CM | POA: Diagnosis not present

## 2017-04-18 DIAGNOSIS — Z0000 Encounter for general adult medical examination without abnormal findings: Secondary | ICD-10-CM | POA: Diagnosis not present

## 2017-04-18 DIAGNOSIS — Z13220 Encounter for screening for lipoid disorders: Secondary | ICD-10-CM | POA: Diagnosis not present

## 2017-04-18 DIAGNOSIS — I10 Essential (primary) hypertension: Secondary | ICD-10-CM | POA: Diagnosis not present

## 2017-04-18 DIAGNOSIS — E7800 Pure hypercholesterolemia, unspecified: Secondary | ICD-10-CM | POA: Diagnosis not present

## 2017-04-18 DIAGNOSIS — C61 Malignant neoplasm of prostate: Secondary | ICD-10-CM | POA: Diagnosis not present

## 2017-04-21 DIAGNOSIS — M79671 Pain in right foot: Secondary | ICD-10-CM | POA: Diagnosis not present

## 2017-05-26 DIAGNOSIS — H2513 Age-related nuclear cataract, bilateral: Secondary | ICD-10-CM | POA: Diagnosis not present

## 2017-05-27 DIAGNOSIS — Z5181 Encounter for therapeutic drug level monitoring: Secondary | ICD-10-CM | POA: Diagnosis not present

## 2017-05-27 DIAGNOSIS — Z111 Encounter for screening for respiratory tuberculosis: Secondary | ICD-10-CM | POA: Diagnosis not present

## 2017-05-27 DIAGNOSIS — I10 Essential (primary) hypertension: Secondary | ICD-10-CM | POA: Diagnosis not present

## 2017-06-02 DIAGNOSIS — L821 Other seborrheic keratosis: Secondary | ICD-10-CM | POA: Diagnosis not present

## 2017-06-02 DIAGNOSIS — D2261 Melanocytic nevi of right upper limb, including shoulder: Secondary | ICD-10-CM | POA: Diagnosis not present

## 2017-06-02 DIAGNOSIS — D225 Melanocytic nevi of trunk: Secondary | ICD-10-CM | POA: Diagnosis not present

## 2017-06-02 DIAGNOSIS — D1801 Hemangioma of skin and subcutaneous tissue: Secondary | ICD-10-CM | POA: Diagnosis not present

## 2017-06-02 DIAGNOSIS — D2262 Melanocytic nevi of left upper limb, including shoulder: Secondary | ICD-10-CM | POA: Diagnosis not present

## 2017-06-18 ENCOUNTER — Ambulatory Visit (INDEPENDENT_AMBULATORY_CARE_PROVIDER_SITE_OTHER): Payer: Medicare Other | Admitting: Family Medicine

## 2017-06-18 ENCOUNTER — Encounter: Payer: Self-pay | Admitting: Family Medicine

## 2017-06-18 DIAGNOSIS — M21619 Bunion of unspecified foot: Secondary | ICD-10-CM | POA: Diagnosis not present

## 2017-06-18 DIAGNOSIS — M7701 Medial epicondylitis, right elbow: Secondary | ICD-10-CM | POA: Diagnosis present

## 2017-06-18 NOTE — Progress Notes (Signed)
HPI  CC: R foot Bunion and R elbow pain  He has had bunions for years. His wife is a patient of Dr. Oneida Alar and one of his Pickle ball partners has had orthotics made and Brian Mosley was wondering that while he is not having pain or symptoms to complain about if there are measures he can take today to prevent his bunion from worsening. He had seen an orthopedic doctor about 5 years ago and was told that if he ever starts experiencing pain then he can schedule a consultation with them to explore surgical options. Denies any swelling or gait problems. The only injury he can recall is about 6 years ago he was playing pickle ball and his right foot was planted and when he turned to hit a ball he heard a pop in his toe.  He complains of R elbow pain that has been intermittently present for the past 4 weeks. It is located on the medial side. He does not complain of any discomfort while opening the door but notices that it mostly hurts while playing pickle ball. He has tried taking Aleve and Advil which has helped some and got an elbow compression sleeve at [facility] which has also helped some. He denies any swelling or known trauma to the Right elbow  Bunion: Traumatic: Yes - 6 years ago while playing pickleball Location: Right greater than left big toe  Quality: No pain Duration: 6 years Timing: N/A Improving/Worsening: N/A  Makes better: Comfortable shoes Makes worse: N/A Associated symptoms: None  Previous Interventions Tried: Wearing proper shoes   Past Injuries: None formally diagnosed or known other than stated above Past Surgeries: No previous feet surgery Smoking: Not ascessed Family Hx: Non-contribuatory   ROS: Per HPI; in addition no fever, no rash, no additional weakness, no additional numbness, no additional paresthesias, and no additional falls/injury.   Objective: BP (!) 138/94   Ht 5\' 7"  (1.702 m)   Wt 145 lb (65.8 kg)   BMI 22.71 kg/m  Gen: Right-Hand Dominant. NAD, well  groomed, a/o x3, normal affect.  CV: Well-perfused. Warm.  Resp: Non-labored.  Neuro: No gross coordination deficits.  Gait: Nonpathologic posture, unremarkable stride without signs of limp or balance issues. MSK: Hallux Valgus noted R worse than L with the big R toe showing signs of pronation and overlapping the second digit of the right foot. Second Ray dominant. Flexible Logitudianal arch noted bilaterally. No tenderness to palpation. Full ROM in all directions with some laxity noted in the ankle with anterior drawer test and talar tilt.  Right Elbow: Inspection yielded no erythema, ecchymosis, or bony deformity.  TTP along the medial epicondyle.  Exacerbation of pain with firm grip and digital flexion against resistance.  ROM full in all directions.  Strength 5/5 bilaterally.  Sensation intact bilaterally.  Assessment and Plan:  Medial epicondylitis, right elbow Acute. As evidenced by history of playing racquet sports and tenderness along the medial epicondyle during the physical exam. 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 As evidenced on physical exam. Patient denies any current symptoms. Looking for preventative measures. Patient counseled that there is no way to predict the future of whether he will have pain or if intervention will help prevent. After discussion, patient would like to proceed with custom orthotics today. All questions answered.  Patient was fitted for a : standard, cushioned, semi-rigid orthotic. The orthotic was heated and afterward the patient  stood on the orthotic blank positioned on the orthotic stand. The patient was positioned in subtalar neutral position and 10 degrees of ankle dorsiflexion in a weight bearing stance. After completion of molding, a stable base was applied to the orthotic blank. The blank was ground to a stable position for weight bearing. Size:  11 Base: Blue Posting: none Additional orthotic padding: none   Brian Hausen, MD Family Medicine Resident 06/18/2017 5:47 PM   Attending/Fellow Addendum: I have discussed this patient's visit at length and in detail with Dr. Noberto Retort. I have examined the patient personally and agree with the documented physical exam, assessment, and resulting plan. I discussed the treatment plan personally w/ the patient.   I spent 30 minutes with this patient. Over 50% of visit was spent in counseling and coordination of care for problems with right bunion formation, flexible longitudinal arch, and medial epicondylitis.  Elberta Leatherwood, MD,MS [facility] Fellow 06/18/2017 6:01 PM

## 2017-06-18 NOTE — Assessment & Plan Note (Addendum)
Chronic. As evidenced on physical exam. Patient denies any current symptoms. Looking for preventative measures. Patient counseled that there is no way to predict the future of whether he will have pain or if intervention will help prevent. After discussion, patient would like to proceed with custom orthotics today. All questions answered.  Patient was fitted for a : standard, cushioned, semi-rigid orthotic. The orthotic was heated and afterward the patient stood on the orthotic blank positioned on the orthotic stand. The patient was positioned in subtalar neutral position and 10 degrees of ankle dorsiflexion in a weight bearing stance. After completion of molding, a stable base was applied to the orthotic blank. The blank was ground to a stable position for weight bearing. Size: 11 Base: Blue Posting: none Additional orthotic padding: none

## 2017-06-18 NOTE — Assessment & Plan Note (Signed)
Acute. As evidenced by history of playing racquet sports and tenderness along the medial epicondyle during the physical exam. Recommended conservative treatment: -Rest -Ice after use -Aleve BID PRN -Compression PRN

## 2018-05-19 DIAGNOSIS — E559 Vitamin D deficiency, unspecified: Secondary | ICD-10-CM | POA: Diagnosis not present

## 2018-05-19 DIAGNOSIS — M159 Polyosteoarthritis, unspecified: Secondary | ICD-10-CM | POA: Diagnosis not present

## 2018-05-19 DIAGNOSIS — Z23 Encounter for immunization: Secondary | ICD-10-CM | POA: Diagnosis not present

## 2018-05-19 DIAGNOSIS — K5909 Other constipation: Secondary | ICD-10-CM | POA: Diagnosis not present

## 2018-05-19 DIAGNOSIS — K219 Gastro-esophageal reflux disease without esophagitis: Secondary | ICD-10-CM | POA: Diagnosis not present

## 2018-05-19 DIAGNOSIS — Z1283 Encounter for screening for malignant neoplasm of skin: Secondary | ICD-10-CM | POA: Diagnosis not present

## 2018-05-19 DIAGNOSIS — Z1211 Encounter for screening for malignant neoplasm of colon: Secondary | ICD-10-CM | POA: Diagnosis not present

## 2018-05-19 DIAGNOSIS — Z125 Encounter for screening for malignant neoplasm of prostate: Secondary | ICD-10-CM | POA: Diagnosis not present

## 2018-05-19 DIAGNOSIS — Z135 Encounter for screening for eye and ear disorders: Secondary | ICD-10-CM | POA: Diagnosis not present

## 2018-05-19 DIAGNOSIS — R7301 Impaired fasting glucose: Secondary | ICD-10-CM | POA: Diagnosis not present

## 2018-05-19 DIAGNOSIS — I10 Essential (primary) hypertension: Secondary | ICD-10-CM | POA: Diagnosis not present

## 2018-05-19 DIAGNOSIS — E782 Mixed hyperlipidemia: Secondary | ICD-10-CM | POA: Diagnosis not present

## 2018-06-01 DIAGNOSIS — L814 Other melanin hyperpigmentation: Secondary | ICD-10-CM | POA: Diagnosis not present

## 2018-06-01 DIAGNOSIS — D1801 Hemangioma of skin and subcutaneous tissue: Secondary | ICD-10-CM | POA: Diagnosis not present

## 2018-06-01 DIAGNOSIS — L821 Other seborrheic keratosis: Secondary | ICD-10-CM | POA: Diagnosis not present

## 2018-06-01 DIAGNOSIS — L570 Actinic keratosis: Secondary | ICD-10-CM | POA: Diagnosis not present

## 2018-06-01 DIAGNOSIS — X32XXXA Exposure to sunlight, initial encounter: Secondary | ICD-10-CM | POA: Diagnosis not present

## 2018-07-21 DIAGNOSIS — G43109 Migraine with aura, not intractable, without status migrainosus: Secondary | ICD-10-CM | POA: Diagnosis not present

## 2018-07-21 DIAGNOSIS — E782 Mixed hyperlipidemia: Secondary | ICD-10-CM | POA: Diagnosis not present

## 2018-07-21 DIAGNOSIS — K219 Gastro-esophageal reflux disease without esophagitis: Secondary | ICD-10-CM | POA: Diagnosis not present

## 2018-07-21 DIAGNOSIS — I10 Essential (primary) hypertension: Secondary | ICD-10-CM | POA: Diagnosis not present

## 2018-08-06 DIAGNOSIS — H04123 Dry eye syndrome of bilateral lacrimal glands: Secondary | ICD-10-CM | POA: Diagnosis not present

## 2018-08-06 DIAGNOSIS — H2513 Age-related nuclear cataract, bilateral: Secondary | ICD-10-CM | POA: Diagnosis not present

## 2018-08-06 DIAGNOSIS — G43109 Migraine with aura, not intractable, without status migrainosus: Secondary | ICD-10-CM | POA: Diagnosis not present

## 2018-08-06 DIAGNOSIS — H35363 Drusen (degenerative) of macula, bilateral: Secondary | ICD-10-CM | POA: Diagnosis not present
